# Patient Record
Sex: Female | Born: 1965 | Race: White | Hispanic: No | Marital: Married | Smoking: Former smoker
Health system: Southern US, Community
[De-identification: ages and names within clinical notes are randomized; demographics above are authoritative.]

## PROBLEM LIST (undated history)

## (undated) DIAGNOSIS — F419 Anxiety disorder, unspecified: Secondary | ICD-10-CM

## (undated) DIAGNOSIS — E785 Hyperlipidemia, unspecified: Secondary | ICD-10-CM

## (undated) DIAGNOSIS — J45909 Unspecified asthma, uncomplicated: Secondary | ICD-10-CM

## (undated) DIAGNOSIS — E119 Type 2 diabetes mellitus without complications: Secondary | ICD-10-CM

## (undated) DIAGNOSIS — E039 Hypothyroidism, unspecified: Secondary | ICD-10-CM

## (undated) DIAGNOSIS — F32A Depression, unspecified: Secondary | ICD-10-CM

## (undated) DIAGNOSIS — K219 Gastro-esophageal reflux disease without esophagitis: Secondary | ICD-10-CM

## (undated) DIAGNOSIS — E079 Disorder of thyroid, unspecified: Secondary | ICD-10-CM

## (undated) DIAGNOSIS — T7840XA Allergy, unspecified, initial encounter: Secondary | ICD-10-CM

## (undated) HISTORY — PX: NASAL SEPTUM SURGERY: SHX37

---

## 2005-10-21 ENCOUNTER — Ambulatory Visit: Payer: Self-pay | Admitting: Internal Medicine

## 2007-02-18 ENCOUNTER — Ambulatory Visit: Payer: Self-pay

## 2008-07-04 ENCOUNTER — Ambulatory Visit: Payer: Self-pay | Admitting: Internal Medicine

## 2011-09-09 ENCOUNTER — Ambulatory Visit: Payer: Self-pay | Admitting: Internal Medicine

## 2014-04-07 ENCOUNTER — Ambulatory Visit: Payer: Self-pay

## 2016-02-21 ENCOUNTER — Other Ambulatory Visit: Payer: Self-pay | Admitting: Obstetrics and Gynecology

## 2016-02-21 DIAGNOSIS — Z1231 Encounter for screening mammogram for malignant neoplasm of breast: Secondary | ICD-10-CM

## 2016-04-02 ENCOUNTER — Ambulatory Visit: Admission: RE | Admit: 2016-04-02 | Payer: Self-pay | Source: Ambulatory Visit

## 2017-04-14 ENCOUNTER — Other Ambulatory Visit: Payer: Self-pay | Admitting: Obstetrics and Gynecology

## 2017-04-14 DIAGNOSIS — Z1231 Encounter for screening mammogram for malignant neoplasm of breast: Secondary | ICD-10-CM

## 2017-04-16 ENCOUNTER — Encounter: Payer: Self-pay | Admitting: Radiology

## 2017-04-16 ENCOUNTER — Ambulatory Visit
Admission: RE | Admit: 2017-04-16 | Discharge: 2017-04-16 | Disposition: A | Discharge status: Home / Self Care | Payer: Managed Care, Other (non HMO) | Source: Ambulatory Visit | Attending: Obstetrics and Gynecology | Admitting: Obstetrics and Gynecology

## 2017-04-16 DIAGNOSIS — Z1231 Encounter for screening mammogram for malignant neoplasm of breast: Secondary | ICD-10-CM | POA: Insufficient documentation

## 2018-04-07 ENCOUNTER — Other Ambulatory Visit: Payer: Self-pay | Admitting: Obstetrics and Gynecology

## 2018-04-07 DIAGNOSIS — Z1231 Encounter for screening mammogram for malignant neoplasm of breast: Secondary | ICD-10-CM

## 2018-04-20 ENCOUNTER — Ambulatory Visit
Admission: RE | Admit: 2018-04-20 | Discharge: 2018-04-20 | Disposition: A | Discharge status: Home / Self Care | Payer: BLUE CROSS/BLUE SHIELD | Source: Ambulatory Visit | Attending: Obstetrics and Gynecology | Admitting: Obstetrics and Gynecology

## 2018-04-20 DIAGNOSIS — Z1231 Encounter for screening mammogram for malignant neoplasm of breast: Secondary | ICD-10-CM | POA: Diagnosis present

## 2019-07-27 ENCOUNTER — Ambulatory Visit: Payer: BLUE CROSS/BLUE SHIELD | Admitting: Dermatology

## 2019-07-28 ENCOUNTER — Ambulatory Visit: Payer: Commercial Managed Care - PPO | Admitting: Dermatology

## 2019-07-28 ENCOUNTER — Other Ambulatory Visit: Payer: Self-pay

## 2019-07-28 ENCOUNTER — Encounter: Payer: Self-pay | Admitting: Dermatology

## 2019-07-28 DIAGNOSIS — K130 Diseases of lips: Secondary | ICD-10-CM

## 2019-07-28 DIAGNOSIS — L853 Xerosis cutis: Secondary | ICD-10-CM | POA: Diagnosis not present

## 2019-07-28 DIAGNOSIS — L304 Erythema intertrigo: Secondary | ICD-10-CM | POA: Diagnosis not present

## 2019-07-28 DIAGNOSIS — L219 Seborrheic dermatitis, unspecified: Secondary | ICD-10-CM

## 2019-07-28 MED ORDER — KETOCONAZOLE 2 % EX SHAM: MEDICATED_SHAMPOO | CUTANEOUS | 1 refills | Status: AC

## 2019-07-28 MED ORDER — CLOBETASOL PROPIONATE 0.05 % EX FOAM: Freq: Two times a day (BID) | CUTANEOUS | 0 refills | Status: DC

## 2019-07-28 MED ORDER — HYDROCORTISONE 2.5 % EX CREA: TOPICAL_CREAM | Freq: Two times a day (BID) | CUTANEOUS | 3 refills | Status: AC | PRN

## 2019-07-28 MED ORDER — FLUCONAZOLE 200 MG PO TABS: 200.0000 mg | ORAL_TABLET | Freq: Every day | ORAL | 0 refills | Status: AC

## 2019-07-28 NOTE — Progress Notes (Signed)
   Follow-Up Visit   Subjective  Monica Dunn is a 54 y.o. female who presents for the following: Rash (around ears and in ears, and around lips, used Ketoconazole cream , TMC lotion, alcortin a,. does not like using the cream makes a mess at hairline and in ears.) and Dry feet (several years).  The following portions of the chart were reviewed this encounter and updated as appropriate: Allergies  Meds  Problems  Med Hx  Surg Hx  Fam Hx      Review of Systems: No other skin or systemic complaints.  Objective  Well appearing patient in no apparent distress; mood and affect are within normal limits.  A focused examination was performed including head, including the scalp, face, neck, nose, ears, eyelids, and lips. Relevant physical exam findings are noted in the Assessment and Plan.  Objective  Lips: scale and erythema of lips with erythematous papules at vermillion border  Objective  Gluteal Crease: Erythema gluteal crease  Objective  Left Ear, Right Ear: Pink patches with greasy scale.   Assessment & Plan  Cheilitis Lips  Favor contact dermatitis > fungal  Ketoconazole cream BID to lips twice daily Hydrocortisone cream BID to lips twice daily  Avoid all topicals other than the medications and plain vaseline or aquaphor lip balm  Patient is very concerned about this and has taken fluconazole in the past without any problems. Per patient request, will send in fluconazole 200 mg po, take once and repeat in one week if needed for this and intertrigo.   Erythema intertrigo Gluteal Crease  Will start Ketoconazole cream twice daily to buttocks (gluteal crease) Will start HC 2.5% cream twice daily prn irritation  Topical steroids (such as triamcinolone, fluocinolone, fluocinonide, mometasone, clobetasol, halobetasol, betamethasone, hydrocortisone) can cause thinning and lightening of the skin if they are used for too long in the same area. Your physician has selected  the right strength medicine for your problem and area affected on the body. Please use your medication only as directed by your physician to prevent side effects.    hydrocortisone 2.5 % cream - Gluteal Crease  Ordered Medications: fluconazole (DIFLUCAN) 200 MG tablet  Seborrheic dermatitis (2) Left Ear; Right Ear  Chronic, flared.  Scalp and ears: Start ketoconazole shampoo three times per week and leave in 10 minutes  Start clobetasol foam daily to affected areas as needed will start clobetasol solution if foam is not covered.  Face: Start hydrocortisone 2.5% cream twice a day as needed  Topical steroids (such as triamcinolone, fluocinolone, fluocinonide, mometasone, clobetasol, halobetasol, betamethasone, hydrocortisone) can cause thinning and lightening of the skin if they are used for too long in the same area. Your physician has selected the right strength medicine for your problem and area affected on the body. Please use your medication only as directed by your physician to prevent side effects.     Ordered Medications: clobetasol (OLUX) 0.05 % topical foam ketoconazole (NIZORAL) 2 % shampoo  Xerosis of feet - Can use urea 20% cream daily as needed - Can also use Baby Foot treatment.   Return in about 4 weeks (around 08/25/2019).   ILeward Quan, CMA, am acting as scribe for Darden Dates, MD .  Documentation: I have reviewed the above documentation for accuracy and completeness, and I agree with the above.  Darden Dates, MD

## 2019-07-28 NOTE — Patient Instructions (Addendum)
Scalp and ears: Start ketoconazole shampoo three times per week and leave in 10 minutes Start clobetasol foam daily to affected areas as needed will start clobetasol solution.if foam is not covered. Face: Start hydrocortisone 2.5% cream twice a day as needed  Will start Ketoconazole cream twice daily to buttocks (gluteal crease) Will start HC 2.5% cream twice daily   Ketoconazole cream BID to lips twice daily Hydrocortisone cream BID to lips twice daily  Topical steroids (such as triamcinolone, fluocinolone, fluocinonide, mometasone, clobetasol, halobetasol, betamethasone, hydrocortisone) can cause thinning and lightening of the skin if they are used for too long in the same area. Your physician has selected the right strength medicine for your problem and area affected on the body. Please use your medication only as directed by your physician to prevent side effects.   Fluconazole can rarely cause irritation of the liver or an allergic reaction.

## 2019-07-31 ENCOUNTER — Encounter: Payer: Self-pay | Admitting: Dermatology

## 2019-08-15 ENCOUNTER — Encounter: Payer: Self-pay | Admitting: Dermatology

## 2019-08-26 ENCOUNTER — Ambulatory Visit: Payer: Commercial Managed Care - PPO | Admitting: Dermatology

## 2019-08-31 ENCOUNTER — Telehealth: Payer: Self-pay

## 2019-08-31 NOTE — Telephone Encounter (Signed)
Spoke with patient regarding below MyChart message. She was advised to use the mupirocin twice a day to inside of nose and Vaseline in between as needed. Patient already has mupirocin so I did not need to send any in.    PS   [name] Female, 54 y.o., 1965/08/30 MRN:  [id number] Phone:  [phone] (H) ... PCP:  Kandyce Rud, MD [facility] This message is to inform you that the patient has not yet read the following message. (Notification date: August 30, 2019)  RE: Visit Follow-Up Question  From  [place], [place], MD To  Monica Dunn Sent and Delivered  08/16/2019 3:26 PM  Hi Ms. Heckler,   I am glad the foam is working and the ketoconazole is helping with your lips. For your nose, I would recommend applying prescription mupirocin antibiotic ointment twice a day to the inside of the nose, and vaseline as needed in between.   Be gentle when blowing your nose and be sure you only use soft tissues. We can recheck this at your follow-up appointment.   Can you please let us know which pharmacy you would like the mupirocin sent to?   Best,  Dr. Neale Burly   Previous Messages  ----- Message -----    From:CMA Butch Penny    Sent:08/16/2019 11:39 AM EDT     PY:[id number] E [place]  Subject:RE: Visit Follow-Up Question   Dr. Neale Burly will be in the office tomorrow and we can check with her.   ----- Message -----    From:[name]    Sent:08/15/2019 11:52 AM EDT     SN:[id number] MOYE, MD  Subject:Visit Follow-Up Question   Dr. Neale Burly,   I now have the pealing and redness inside my nose. The ketoconazole cream says not to put in nose. So what can I do about this? It is so embarrassing because it looks like my nose is dirty all the time. I do use Nasacort daily and I use nose spray all the time so I know my nose is not dried out. It did this once before recently but I didn't think anything about it. Now it has come back with a  vengeance. Definitely would like for it to be cleared up before my daughter's wedding on Sep 05, 2019.   Good news! The foam is working so good around my ears. And the ketoconazole cream seems to be working around my lips but it is slow. Oh and the CLOBETASOL PROP 0.05% FOAM is only $10 with the insurance.   Thank you for your help!  Eduard Roux   [employer] User Last Read On  Monica Dunn Not Read

## 2019-09-20 ENCOUNTER — Other Ambulatory Visit: Payer: Self-pay | Admitting: Dermatology

## 2019-09-20 DIAGNOSIS — L219 Seborrheic dermatitis, unspecified: Secondary | ICD-10-CM

## 2020-01-16 ENCOUNTER — Other Ambulatory Visit: Payer: Self-pay | Admitting: Family Medicine

## 2020-01-16 DIAGNOSIS — Z1231 Encounter for screening mammogram for malignant neoplasm of breast: Secondary | ICD-10-CM

## 2020-02-21 ENCOUNTER — Other Ambulatory Visit: Payer: Self-pay

## 2020-02-21 ENCOUNTER — Encounter: Payer: Self-pay | Admitting: Emergency Medicine

## 2020-02-21 ENCOUNTER — Emergency Department: Payer: Commercial Managed Care - PPO

## 2020-02-21 DIAGNOSIS — Z87891 Personal history of nicotine dependence: Secondary | ICD-10-CM | POA: Insufficient documentation

## 2020-02-21 DIAGNOSIS — R0789 Other chest pain: Secondary | ICD-10-CM | POA: Insufficient documentation

## 2020-02-21 DIAGNOSIS — Z79899 Other long term (current) drug therapy: Secondary | ICD-10-CM | POA: Insufficient documentation

## 2020-02-21 DIAGNOSIS — R0600 Dyspnea, unspecified: Secondary | ICD-10-CM | POA: Diagnosis not present

## 2020-02-21 DIAGNOSIS — E119 Type 2 diabetes mellitus without complications: Secondary | ICD-10-CM | POA: Diagnosis not present

## 2020-02-21 LAB — CBC
HCT: 44.5 % (ref 36.0–46.0)
Hemoglobin: 14.4 g/dL (ref 12.0–15.0)
MCH: 27.4 pg (ref 26.0–34.0)
MCHC: 32.4 g/dL (ref 30.0–36.0)
MCV: 84.6 fL (ref 80.0–100.0)
Platelets: 274 10*3/uL (ref 150–400)
RBC: 5.26 MIL/uL — ABNORMAL HIGH (ref 3.87–5.11)
RDW: 13.8 % (ref 11.5–15.5)
WBC: 12 10*3/uL — ABNORMAL HIGH (ref 4.0–10.5)
nRBC: 0 % (ref 0.0–0.2)

## 2020-02-21 LAB — BASIC METABOLIC PANEL
Anion gap: 8 (ref 5–15)
BUN: 15 mg/dL (ref 6–20)
CO2: 30 mmol/L (ref 22–32)
Calcium: 9.3 mg/dL (ref 8.9–10.3)
Chloride: 100 mmol/L (ref 98–111)
Creatinine, Ser: 0.83 mg/dL (ref 0.44–1.00)
GFR, Estimated: >60 mL/min (ref >60)
Glucose, Bld: 115 mg/dL — ABNORMAL HIGH (ref 70–99)
Potassium: 3.7 mmol/L (ref 3.5–5.1)
Sodium: 138 mmol/L (ref 135–145)

## 2020-02-21 LAB — TROPONIN I (HIGH SENSITIVITY)
Troponin I (High Sensitivity): 3 ng/L (ref <18)
Troponin I (High Sensitivity): 3 ng/L (ref <18)

## 2020-02-21 NOTE — ED Triage Notes (Signed)
Pt arrived via w/c from [facility] with reports of neck pain that radiated down into chest, pt states she has chest pain when she takes in a deep breath.  Pt reports slight cough that is productive at times.  Pt also reports she is having sinus issues.

## 2020-02-22 ENCOUNTER — Emergency Department: Payer: Commercial Managed Care - PPO

## 2020-02-22 ENCOUNTER — Emergency Department
Admission: EM | Admit: 2020-02-22 | Discharge: 2020-02-22 | Disposition: A | Discharge status: Home / Self Care | Payer: Commercial Managed Care - PPO | Attending: Emergency Medicine | Admitting: Emergency Medicine

## 2020-02-22 DIAGNOSIS — R079 Chest pain, unspecified: Secondary | ICD-10-CM

## 2020-02-22 HISTORY — DX: Disorder of thyroid, unspecified: E07.9

## 2020-02-22 HISTORY — DX: Type 2 diabetes mellitus without complications: E11.9

## 2020-02-22 MED ORDER — IOHEXOL 350 MG/ML SOLN
75.0000 mL | Freq: Once | INTRAVENOUS | Status: AC | PRN
  Administered 2020-02-22: 75 mL via INTRAVENOUS

## 2020-02-22 NOTE — ED Provider Notes (Addendum)
[facility] Emergency Department Provider Note  Time seen: 3:12 AM  I have reviewed the triage vital signs and the nursing notes.   HISTORY  Chief Complaint Chest Pain   HPI NYKIA TURKO is a 54 y.o. female with a past medical history of diabetes presents to the emergency department for chest pain.  According to the patient this evening she has developed some chest tightness and shortness of breath.  States is worse with deep inspiration.  Denies any cough congestion or fever.  No leg pain or swelling.  No history of cardiac disease.  Describes the tightness sensation is mild.   Past Medical History:  Diagnosis Date  . Diabetes mellitus without complication ([place])   . Thyroid disease     There are no problems to display for this patient.   Past Surgical History:  Procedure Laterality Date  . CESAREAN SECTION    . NASAL SEPTUM SURGERY      Prior to Admission medications   Medication Sig Start Date End Date Taking? Authorizing Provider  ALPRAZolam (XANAX) 0.25 MG tablet TAKE 1 TABLET BY MOUTH EVERY DAY AS NEEDED 07/14/19   [provider]  Blood Glucose Monitoring Suppl (ACCU-CHEK GUIDE) w/Device KIT USE AS DIRECTED. DX E11.9 03/02/19   [provider]  Blood Glucose Monitoring Suppl (FIFTY50 GLUCOSE METER 2.0) w/Device KIT Use as directed 03/02/19 03/01/20  [provider]  citalopram (CELEXA) 20 MG tablet Take by mouth. 01/03/19 01/03/20  [provider]  clobetasol (OLUX) 0.05 % topical foam APPLY TO AFFECTED AREA TWICE A DAY 09/21/19   Moye, [place], MD  doxycycline (VIBRAMYCIN) 100 MG capsule Take 100 mg by mouth 2 (two) times daily. 03/04/19   [provider]  fluconazole (DIFLUCAN) 200 MG tablet Take 1 tablet (200 mg total) by mouth daily. Take one tablet today, then 1 tablet in 1 week if needed 07/28/19   [facility], [place], MD  Fluticasone-Salmeterol (ADVAIR DISKUS) 250-50 MCG/DOSE AEPB INHALE 1 PUFF BY MOUTH  TWICE A DAY 07/05/19   [provider]  glipiZIDE (GLUCOTROL XL) 10 MG 24 hr tablet TAKE 1 TABLET BY MOUTH EVERY DAY 01/03/19   [provider]  hydrocortisone 2.5 % cream Apply topically 2 (two) times daily as needed (Rash). 07/28/19   Moye, [place], MD  ketoconazole (NIZORAL) 2 % shampoo Use up to  three times per week and leave in 10 minutes 07/28/19   Moye, [place], MD  levothyroxine (SYNTHROID) 137 MCG tablet Take by mouth. 04/12/19 04/11/20  [provider]  loratadine-pseudoephedrine (CLARITIN-D 24-HOUR) 10-240 MG 24 hr tablet TAKE 1 TABLET BY MOUTH EVERY DAY 09/01/18   [provider]  metFORMIN (GLUCOPHAGE-XR) 500 MG 24 hr tablet Take by mouth. 01/03/19   [provider]  montelukast (SINGULAIR) 10 MG tablet TAKE 1 TABLET BY MOUTH EVERY DAY AT NIGHT 01/03/19   [provider]  mupirocin ointment (BACTROBAN) 2 % APPLY TO AFFECTED AREA 3 TIMES A DAY FOR 7 DAYS 07/14/19   [provider]  nystatin (MYCOSTATIN/NYSTOP) powder APPLY TOPICALLY 2 TIMES DAILY AS NEEDED 06/06/19   [provider]  pantoprazole (PROTONIX) 40 MG tablet Take by mouth. 07/12/19 07/11/20  [provider]  simvastatin (ZOCOR) 10 MG tablet TAKE 1 TABLET BY MOUTH EVERY DAY AT NIGHT 01/03/19   [provider]  triamcinolone (NASACORT) 55 MCG/ACT AERO nasal inhaler Place into the nose.    [provider]    Allergies  Allergen Reactions  . Codeine  Nausea Only  . Norfloxacin Nausea Only  . Tramadol-Acetaminophen     Other reaction(s): Unknown  . Penicillins Rash    Family History  Problem Relation Age of Onset  . Prostate cancer Father   . Ovarian cancer Sister        late 14's  . Breast cancer Maternal Aunt        60's    Social History Social History   Tobacco Use  . Smoking status: Former Smoker    Quit date: 12/07/1993    Years since quitting: 26.2  . Smokeless tobacco: Never Used  Vaping Use  . Vaping Use: Never used   Substance Use Topics  . Alcohol use: Not on file  . Drug use: Not on file    Review of Systems Constitutional: Negative for fever. Cardiovascular: Mild chest tightness, worse with deep inspiration. Respiratory: Mild shortness of breath. Gastrointestinal: Negative for abdominal pain, vomiting Musculoskeletal: Negative for musculoskeletal complaints Neurological: Negative for headache All other ROS negative  ____________________________________________   PHYSICAL EXAM:  VITAL SIGNS: ED Triage Vitals  Enc Vitals Group     BP 02/21/20 1907 (!) 141/61     Pulse Rate 02/21/20 1907 (!) 103     Resp 02/21/20 1907 17     Temp 02/21/20 1907 98.7 F (37.1 C)     Temp Source 02/21/20 1907 Oral     SpO2 02/21/20 1907 97 %     Weight 02/21/20 1907 231 lb (104.8 kg)     Height 02/21/20 1907 _0  (1.6 m)     Head Circumference --      Peak Flow --      Pain Score 02/21/20 1917 7     Pain Loc --      Pain Edu? --      Excl. in [place]? --     Constitutional: Alert and oriented. Well appearing and in no distress. Eyes: Normal exam ENT      Head: Normocephalic and atraumatic.      Mouth/Throat: Mucous membranes are moist. Cardiovascular: Normal rate, regular rhythm around 100 bpm.  No murmur. Respiratory: Normal respiratory effort without tachypnea nor retractions. Breath sounds are clear  Gastrointestinal: Soft and nontender. No distention.  Musculoskeletal: Nontender with normal range of motion in all extremities. No lower extremity tenderness or edema. Neurologic:  Normal speech and language. No gross focal neurologic deficits are appreciated. Skin:  Skin is warm, dry and intact.   Psychiatric: Mood and affect are normal. Speech and behavior are normal.   ____________________________________________    EKG  EKG viewed and interpreted by myself shows sinus tachycardia 105 bpm with a narrow QRS, left axis deviation, largely normal intervals with nonspecific ST  changes.  ____________________________________________    RADIOLOGY  X-rays negative CTA of the chest does not appear to show any large pulmonary embolism on my evaluation of the images. CT of the chest read as negative.  ____________________________________________   INITIAL IMPRESSION / ASSESSMENT AND PLAN / ED COURSE  Pertinent labs & imaging results that were available during my care of the patient were reviewed by me and considered in my medical decision making (see chart for details).   Patient presents to the emergency department for chest tightness and shortness of breath.  Patient states her symptoms are worse with deep inspiration.  Patient's work-up is thus far reassuring including negative troponin x2.  Chest x-ray is normal and EKG is reassuring.  However given the patient's symptoms of pleuritic chest discomfort  along with mild tachycardia we will proceed with a CTA of the chest to rule out pulmonary embolism.  I also discussed the possibility of Covid, patient is fully vaccinated and does not wish to have a Covid test performed.  CTA is negative for PE.  I discussed with the patient the incidental thyroid finding.  We will discharge the patient home with PCP follow-up.  Patient agreeable to plan of care.  DARIUS FILLINGIM was evaluated in Emergency Department on 02/22/2020 for the symptoms described in the history of present illness. She was evaluated in the context of the global COVID-19 pandemic, which necessitated consideration that the patient might be at risk for infection with the SARS-CoV-2 virus that causes COVID-19. Institutional protocols and algorithms that pertain to the evaluation of patients at risk for COVID-19 are in a state of rapid change based on information released by regulatory bodies including the CDC and federal and state organizations. These policies and algorithms were followed during the patient's care in the  ED.  ____________________________________________   FINAL CLINICAL IMPRESSION(S) / ED DIAGNOSES  Dyspnea Chest pain   Harvest Dark, MD 02/22/20 [id number]    Harvest Dark, MD 02/22/20 [phone]

## 2020-02-22 NOTE — Discharge Instructions (Addendum)
Please follow-up with your doctor regarding today's emergency department visit.  Return to the emergency department for any return of/worsening chest pain, trouble breathing, or any other symptom personally concerning to yourself.    Your CT scan of the chest did show a right-sided thyroid nodule, please follow-up with your primary care doctor to arrange for further evaluation such as a nonemergent ultrasound.

## 2020-05-10 ENCOUNTER — Other Ambulatory Visit: Payer: Self-pay

## 2020-05-10 ENCOUNTER — Other Ambulatory Visit
Admission: RE | Admit: 2020-05-10 | Discharge: 2020-05-10 | Disposition: A | Discharge status: Home / Self Care | Payer: Commercial Managed Care - PPO | Source: Ambulatory Visit | Attending: Internal Medicine | Admitting: Internal Medicine

## 2020-05-10 DIAGNOSIS — Z01812 Encounter for preprocedural laboratory examination: Secondary | ICD-10-CM | POA: Diagnosis present

## 2020-05-10 DIAGNOSIS — Z20822 Contact with and (suspected) exposure to covid-19: Secondary | ICD-10-CM | POA: Diagnosis not present

## 2020-05-10 LAB — SARS CORONAVIRUS 2 (TAT 6-24 HRS): SARS Coronavirus 2: NEGATIVE

## 2020-05-11 ENCOUNTER — Encounter: Payer: Self-pay | Admitting: Internal Medicine

## 2020-05-14 ENCOUNTER — Encounter
Admission: RE | Disposition: A | Discharge status: Home / Self Care | Payer: Self-pay | Source: Home / Self Care | Attending: Internal Medicine

## 2020-05-14 ENCOUNTER — Encounter: Payer: Self-pay | Admitting: Internal Medicine

## 2020-05-14 ENCOUNTER — Ambulatory Visit: Payer: Commercial Managed Care - PPO | Admitting: Anesthesiology

## 2020-05-14 ENCOUNTER — Ambulatory Visit
Admission: RE | Admit: 2020-05-14 | Discharge: 2020-05-14 | Disposition: A | Discharge status: Home / Self Care | Payer: Commercial Managed Care - PPO | Attending: Internal Medicine | Admitting: Internal Medicine

## 2020-05-14 ENCOUNTER — Other Ambulatory Visit: Payer: Self-pay

## 2020-05-14 DIAGNOSIS — Z7951 Long term (current) use of inhaled steroids: Secondary | ICD-10-CM | POA: Diagnosis not present

## 2020-05-14 DIAGNOSIS — Z87891 Personal history of nicotine dependence: Secondary | ICD-10-CM | POA: Diagnosis not present

## 2020-05-14 DIAGNOSIS — Z7984 Long term (current) use of oral hypoglycemic drugs: Secondary | ICD-10-CM | POA: Insufficient documentation

## 2020-05-14 DIAGNOSIS — Z79890 Hormone replacement therapy (postmenopausal): Secondary | ICD-10-CM | POA: Insufficient documentation

## 2020-05-14 DIAGNOSIS — Z79899 Other long term (current) drug therapy: Secondary | ICD-10-CM | POA: Insufficient documentation

## 2020-05-14 DIAGNOSIS — K640 First degree hemorrhoids: Secondary | ICD-10-CM | POA: Insufficient documentation

## 2020-05-14 DIAGNOSIS — Z1211 Encounter for screening for malignant neoplasm of colon: Secondary | ICD-10-CM | POA: Diagnosis not present

## 2020-05-14 HISTORY — DX: Unspecified asthma, uncomplicated: J45.909

## 2020-05-14 HISTORY — DX: Anxiety disorder, unspecified: F41.9

## 2020-05-14 HISTORY — PX: COLONOSCOPY WITH PROPOFOL: SHX5780

## 2020-05-14 HISTORY — DX: Depression, unspecified: F32.A

## 2020-05-14 HISTORY — DX: Gastro-esophageal reflux disease without esophagitis: K21.9

## 2020-05-14 HISTORY — DX: Allergy, unspecified, initial encounter: T78.40XA

## 2020-05-14 HISTORY — DX: Hypothyroidism, unspecified: E03.9

## 2020-05-14 HISTORY — DX: Hyperlipidemia, unspecified: E78.5

## 2020-05-14 LAB — GLUCOSE, CAPILLARY: Glucose-Capillary: 171 mg/dL — ABNORMAL HIGH (ref 70–99)

## 2020-05-14 SURGERY — COLONOSCOPY WITH PROPOFOL
Anesthesia: General

## 2020-05-14 MED ORDER — PROPOFOL 500 MG/50ML IV EMUL
INTRAVENOUS | Status: DC | PRN
  Administered 2020-05-14: 150 ug/kg/min via INTRAVENOUS

## 2020-05-14 MED ORDER — SODIUM CHLORIDE 0.9 % IV SOLN: INTRAVENOUS | Status: DC

## 2020-05-14 MED ORDER — PROPOFOL 500 MG/50ML IV EMUL
INTRAVENOUS | Status: AC
  Filled 2020-05-14: qty 50

## 2020-05-14 NOTE — Transfer of Care (Signed)
Immediate Anesthesia Transfer of Care Note  Patient: Monica Dunn  Procedure(s) Performed: COLONOSCOPY WITH PROPOFOL (N/A )  Patient Location: PACU  Anesthesia Type:General  Level of Consciousness: awake and sedated  Airway & Oxygen Therapy: Patient Spontanous Breathing and Patient connected to face mask oxygen  Post-op Assessment: Report given to RN and Post -op Vital signs reviewed and stable  Post vital signs: Reviewed and stable  Last Vitals:  Vitals Value Taken Time  BP    Temp    Pulse    Resp    SpO2      Last Pain:  Vitals:   05/14/20 1012  TempSrc: Temporal  PainSc: 0-No pain         Complications: No complications documented.

## 2020-05-14 NOTE — Interval H&P Note (Signed)
History and Physical Interval Note:  05/14/2020 10:14 AM  Monica Dunn  has presented today for surgery, with the diagnosis of Colon Cancer Screening.  The various methods of treatment have been discussed with the patient and family. After consideration of risks, benefits and other options for treatment, the patient has consented to  Procedure(s) with comments: COLONOSCOPY WITH PROPOFOL (N/A) - COVID 05-10-2020 as a surgical intervention.  The patient's history has been reviewed, patient examined, no change in status, stable for surgery.  I have reviewed the patient's chart and labs.  Questions were answered to the patient's satisfaction.     [place]Ne[place], [place]

## 2020-05-14 NOTE — Anesthesia Preprocedure Evaluation (Signed)
Anesthesia Evaluation  Patient identified by MRN, date of birth, ID band Patient awake    Reviewed: Allergy & Precautions, H&P , NPO status , Patient's Chart, lab work & pertinent test results, reviewed documented beta blocker date and time   Airway Mallampati: II   Neck ROM: full    Dental  (+) Poor Dentition   Pulmonary asthma , former smoker,    Pulmonary exam normal        Cardiovascular Exercise Tolerance: Good negative cardio ROS Normal cardiovascular exam Rhythm:regular Rate:Normal     Neuro/Psych PSYCHIATRIC DISORDERS Anxiety Depression negative neurological ROS     GI/Hepatic Neg liver ROS, GERD  Medicated,  Endo/Other  diabetes, Well Controlled, Type 2, Oral Hypoglycemic AgentsHypothyroidism   Renal/GU negative Renal ROS  negative genitourinary   Musculoskeletal   Abdominal   Peds  Hematology negative hematology ROS (+)   Anesthesia Other Findings Past Medical History: No date: Allergy No date: Anxiety No date: Asthma No date: Depression No date: Diabetes mellitus without complication ([facility]) No date: GERD (gastroesophageal reflux disease) No date: Hyperlipidemia No date: Hypothyroidism No date: Thyroid disease Past Surgical History: No date: CESAREAN SECTION No date: NASAL SEPTUM SURGERY   Reproductive/Obstetrics negative OB ROS                             Anesthesia Physical Anesthesia Plan  ASA: III  Anesthesia Plan: General   Post-op Pain Management:    Induction:   PONV Risk Score and Plan:   Airway Management Planned:   Additional Equipment:   Intra-op Plan:   Post-operative Plan:   Informed Consent: I have reviewed the patients History and Physical, chart, labs and discussed the procedure including the risks, benefits and alternatives for the proposed anesthesia with the patient or authorized representative who has indicated his/her understanding and  acceptance.     Dental Advisory Given  Plan Discussed with: CRNA  Anesthesia Plan Comments:         Anesthesia Quick Evaluation

## 2020-05-14 NOTE — H&P (Signed)
Outpatient short stay form Pre-procedure 05/14/2020 10:13 AM Monica Dunn K. [name], M.D.  Primary Physician: Derinda Late, M.D.  Reason for visit:  Colon cancer screening  History of present illness:  Patient presents for colonoscopy for colon cancer screening. The patient denies complaints of abdominal pain, significant change in bowel habits, or rectal bleeding.      Current Facility-Administered Medications:  .  0.9 %  sodium chloride infusion, , Intravenous, Continuous, Zaina Jenkin, Benay Pike, MD  Medications Prior to Admission  Medication Sig Dispense Refill Last Dose  . ALPRAZolam (XANAX) 0.25 MG tablet TAKE 1 TABLET BY MOUTH EVERY DAY AS NEEDED   Past Week at Unknown time  . Blood Glucose Monitoring Suppl (ACCU-CHEK GUIDE) w/Device KIT USE AS DIRECTED. DX E11.9   05/14/2020 at Unknown time  . clobetasol (OLUX) 0.05 % topical foam APPLY TO AFFECTED AREA TWICE A DAY 50 g 0 Past Week at 0800  . Fluticasone-Salmeterol (ADVAIR) 250-50 MCG/DOSE AEPB INHALE 1 PUFF BY MOUTH TWICE A DAY   Past Week at Unknown time  . glipiZIDE (GLUCOTROL XL) 10 MG 24 hr tablet TAKE 1 TABLET BY MOUTH EVERY DAY   05/13/2020 at 0800  . hydrocortisone 2.5 % cream Apply topically 2 (two) times daily as needed (Rash). 90 g 3 Past Week at Unknown time  . ketoconazole (NIZORAL) 2 % shampoo Use up to  three times per week and leave in 10 minutes 120 mL 1 Past Week at Unknown time  . loratadine-pseudoephedrine (CLARITIN-D 24-HOUR) 10-240 MG 24 hr tablet TAKE 1 TABLET BY MOUTH EVERY DAY   05/13/2020 at 1800  . metFORMIN (GLUCOPHAGE-XR) 500 MG 24 hr tablet Take by mouth.   Past Week at Unknown time  . montelukast (SINGULAIR) 10 MG tablet TAKE 1 TABLET BY MOUTH EVERY DAY AT NIGHT   05/13/2020 at 1800  . mupirocin ointment (BACTROBAN) 2 % APPLY TO AFFECTED AREA 3 TIMES A DAY FOR 7 DAYS   Past Week at Unknown time  . nystatin (MYCOSTATIN/NYSTOP) powder APPLY TOPICALLY 2 TIMES DAILY AS NEEDED   Past Week at Unknown time  . pantoprazole  (PROTONIX) 40 MG tablet Take by mouth.   05/13/2020 at 0810  . simvastatin (ZOCOR) 10 MG tablet TAKE 1 TABLET BY MOUTH EVERY DAY AT NIGHT   05/13/2020 at 1800  . triamcinolone (NASACORT) 55 MCG/ACT AERO nasal inhaler Place into the nose.   Past Week at Unknown time  . citalopram (CELEXA) 20 MG tablet Take by mouth.     . doxycycline (VIBRAMYCIN) 100 MG capsule Take 100 mg by mouth 2 (two) times daily. (Patient not taking: Reported on 05/14/2020)   Completed Course at Unknown time  . fluconazole (DIFLUCAN) 200 MG tablet Take 1 tablet (200 mg total) by mouth daily. Take one tablet today, then 1 tablet in 1 week if needed (Patient not taking: Reported on 05/14/2020) 2 tablet 0 Completed Course at Unknown time  . levothyroxine (SYNTHROID) 137 MCG tablet Take by mouth.        Allergies  Allergen Reactions  . Codeine Nausea Only  . Norfloxacin Nausea Only  . Tramadol-Acetaminophen     Other reaction(s): Unknown  . Penicillins Rash     Past Medical History:  Diagnosis Date  . Allergy   . Anxiety   . Asthma   . Depression   . Diabetes mellitus without complication ([place])   . GERD (gastroesophageal reflux disease)   . Hypothyroidism   . Thyroid disease     Review of systems:  Otherwise negative.    Physical Exam  Gen: Alert, oriented. Appears stated age.  HEENT: [place]/AT. PERRLA. Lungs: CTA, no wheezes. CV: RR nl S1, S2. Abd: soft, benign, no masses. BS+ Ext: No edema. Pulses 2+    Planned procedures: Proceed with colonoscopy. The patient understands the nature of the planned procedure, indications, risks, alternatives and potential complications including but not limited to bleeding, infection, perforation, damage to internal organs and possible oversedation/side effects from anesthesia. The patient agrees and gives consent to proceed.  Please refer to procedure notes for findings, recommendations and patient disposition/instructions.     [name] [name],  M.D. Gastroenterology 05/14/2020  10:13 AM

## 2020-05-14 NOTE — Op Note (Signed)
[facility] Gastroenterology Patient Name: Monica Dunn Procedure Date: 05/14/2020 10:22 AM MRN: [id number] Account #: [id number] Date of Birth: May 22, 1965 Admit Type: Outpatient Age: 55 Room: [facility] ENDO ROOM 2 Gender: Female Note Status: Finalized Procedure:             Colonoscopy Indications:           Screening for colorectal malignant neoplasm Providers:             Boykin Nearing. Norma Fredrickson MD, MD Referring MD:          Hassell Halim MD (Referring MD) Medicines:             Propofol per Anesthesia Complications:         No immediate complications. Procedure:             Pre-Anesthesia Assessment:                        - The risks and benefits of the procedure and the                         sedation options and risks were discussed with the                         patient. All questions were answered and informed                         consent was obtained.                        - Patient identification and proposed procedure were                         verified prior to the procedure by the nurse. The                         procedure was verified in the procedure room.                        - ASA Grade Assessment: III - A patient with severe                         systemic disease.                        - After reviewing the risks and benefits, the patient                         was deemed in satisfactory condition to undergo the                         procedure.                        After obtaining informed consent, the colonoscope was                         passed under direct vision. Throughout the procedure,                         the patient's blood pressure,  pulse, and oxygen                         saturations were monitored continuously. The                         Colonoscope was introduced through the anus and                         advanced to the the cecum, identified by appendiceal                         orifice and ileocecal  valve. The colonoscopy was                         performed without difficulty. The patient tolerated                         the procedure well. The quality of the bowel                         preparation was adequate. The ileocecal valve,                         appendiceal orifice, and rectum were photographed. Findings:      The perianal and digital rectal examinations were normal. Pertinent       negatives include normal sphincter tone and no palpable rectal lesions.      Non-bleeding internal hemorrhoids were found during retroflexion. The       hemorrhoids were Grade I (internal hemorrhoids that do not prolapse).      The colon (entire examined portion) appeared normal. Impression:            - Non-bleeding internal hemorrhoids.                        - The entire examined colon is normal.                        - No specimens collected. Recommendation:        - Patient has a contact number available for                         emergencies. The signs and symptoms of potential                         delayed complications were discussed with the patient.                         Return to normal activities tomorrow. Written                         discharge instructions were provided to the patient.                        - Resume previous diet.                        - Continue present medications.                        -  Repeat colonoscopy in 10 years for screening                         purposes.                        - Return to GI office PRN.                        - The findings and recommendations were discussed with                         the patient. Procedure Code(s):     --- Professional ---                        M3846, Colorectal cancer screening; colonoscopy on                         individual not meeting criteria for high risk Diagnosis Code(s):     --- Professional ---                        K64.0, First degree hemorrhoids                        Z12.11,  Encounter for screening for malignant neoplasm                         of colon CPT copyright 2019 [employer]. All rights reserved. The codes documented in this report are preliminary and upon coder review may  be revised to meet current compliance requirements. Stanton Kidney MD, MD 05/14/2020 10:49:18 AM This report has been signed electronically. Number of Addenda: 0 Note Initiated On: 05/14/2020 10:22 AM Scope Withdrawal Time: 0 hours 7 minutes 7 seconds  Total Procedure Duration: 0 hours 14 minutes 17 seconds  Estimated Blood Loss:  Estimated blood loss: none.      [facility]

## 2020-05-14 NOTE — Anesthesia Procedure Notes (Signed)
Performed by: Cook-Martin, Cindy °Pre-anesthesia Checklist: Patient identified, Emergency Drugs available, Suction available, Patient being monitored and Timeout performed °Patient Re-evaluated:Patient Re-evaluated prior to induction °Oxygen Delivery Method: Simple face mask °Preoxygenation: Pre-oxygenation with 100% oxygen °Induction Type: IV induction °Placement Confirmation: positive ETCO2 and CO2 detector ° ° ° ° ° ° °

## 2020-05-14 NOTE — Anesthesia Postprocedure Evaluation (Signed)
Anesthesia Post Note  Patient: Monica Dunn  Procedure(s) Performed: COLONOSCOPY WITH PROPOFOL (N/A )  Patient location during evaluation: PACU Anesthesia Type: General Level of consciousness: awake and alert Pain management: pain level controlled Vital Signs Assessment: post-procedure vital signs reviewed and stable Respiratory status: spontaneous breathing, nonlabored ventilation, respiratory function stable and patient connected to nasal cannula oxygen Cardiovascular status: blood pressure returned to baseline and stable Postop Assessment: no apparent nausea or vomiting Anesthetic complications: no   No complications documented.   Last Vitals:  Vitals:   05/14/20 1012 05/14/20 1051  BP: 135/79 112/64  Pulse: 89 (!) 102  Resp: 18 16  Temp: (!) 36.3 C 36.4 C  SpO2: 98% 100%    Last Pain:  Vitals:   05/14/20 1051  TempSrc:   PainSc: 0-No pain                 Yevette Edwards

## 2020-05-15 ENCOUNTER — Encounter: Payer: Self-pay | Admitting: Internal Medicine

## 2020-06-22 ENCOUNTER — Other Ambulatory Visit: Payer: Self-pay | Admitting: Family Medicine

## 2020-06-22 DIAGNOSIS — E041 Nontoxic single thyroid nodule: Secondary | ICD-10-CM

## 2020-07-02 ENCOUNTER — Ambulatory Visit
Admission: RE | Admit: 2020-07-02 | Discharge: 2020-07-02 | Disposition: A | Discharge status: Home / Self Care | Payer: Commercial Managed Care - PPO | Source: Ambulatory Visit | Attending: Family Medicine | Admitting: Family Medicine

## 2020-07-02 ENCOUNTER — Other Ambulatory Visit: Payer: Self-pay

## 2020-07-02 DIAGNOSIS — E041 Nontoxic single thyroid nodule: Secondary | ICD-10-CM | POA: Diagnosis present

## 2020-07-30 ENCOUNTER — Other Ambulatory Visit: Payer: Self-pay | Admitting: Dermatology

## 2020-08-31 ENCOUNTER — Other Ambulatory Visit: Payer: Self-pay

## 2020-08-31 ENCOUNTER — Ambulatory Visit
Admission: RE | Admit: 2020-08-31 | Discharge: 2020-08-31 | Disposition: A | Discharge status: Home / Self Care | Payer: Commercial Managed Care - PPO | Source: Ambulatory Visit | Attending: Family Medicine | Admitting: Family Medicine

## 2020-08-31 DIAGNOSIS — Z1231 Encounter for screening mammogram for malignant neoplasm of breast: Secondary | ICD-10-CM | POA: Insufficient documentation

## 2021-06-21 IMAGING — MG MM DIGITAL SCREENING BILAT W/ TOMO AND CAD
8 series · 8 of 24 positions shown · non-contrast
Comparison: Previous exam(s).

CLINICAL DATA: Screening.

EXAM:
DIGITAL SCREENING BILATERAL MAMMOGRAM WITH TOMOSYNTHESIS AND CAD
TECHNIQUE: Bilateral screening digital craniocaudal and mediolateral oblique
mammograms were obtained. Bilateral screening digital breast
tomosynthesis was performed. The images were evaluated with
computer-aided detection.

[R CC synth-2D]
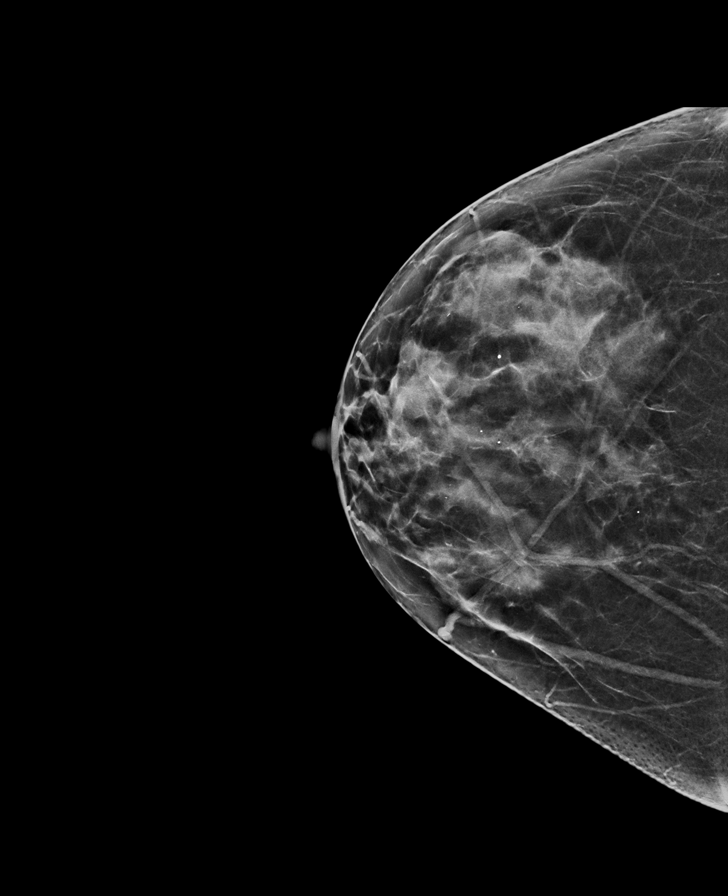

[L MLO synth-2D]
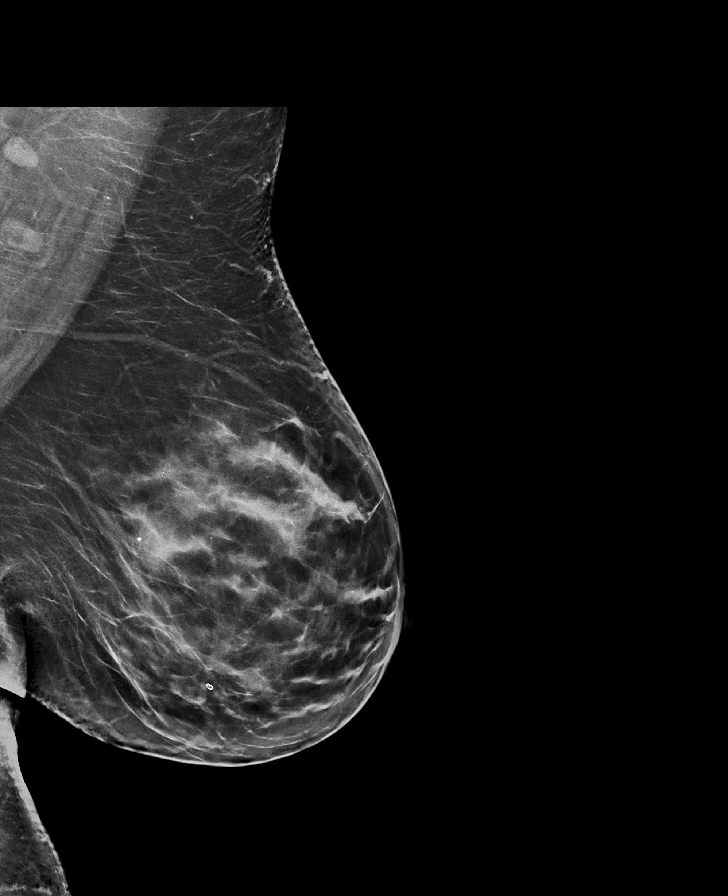

[R MLO synth-2D]
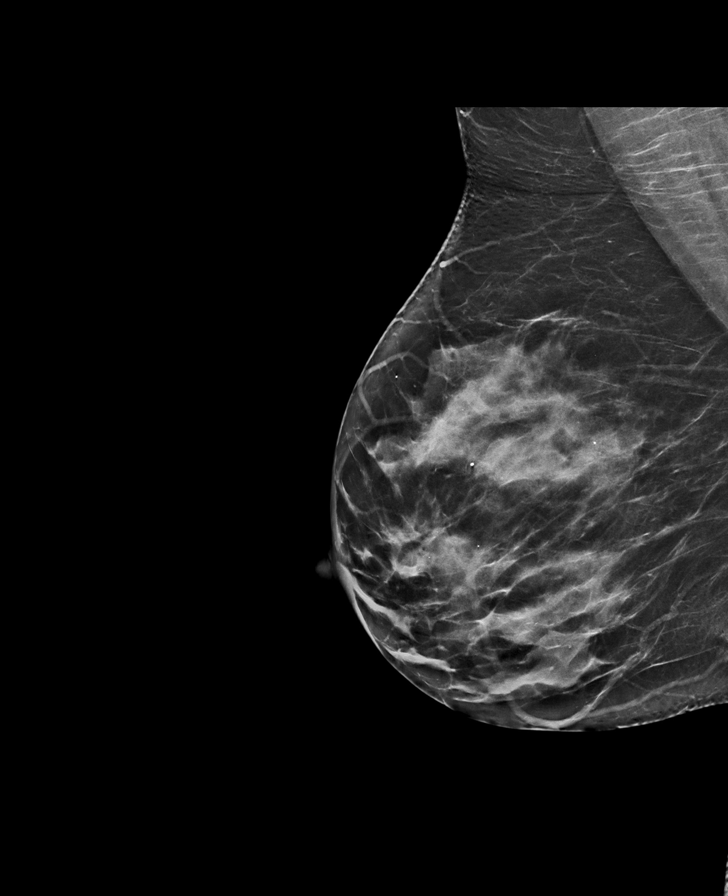

[L CC synth-2D]
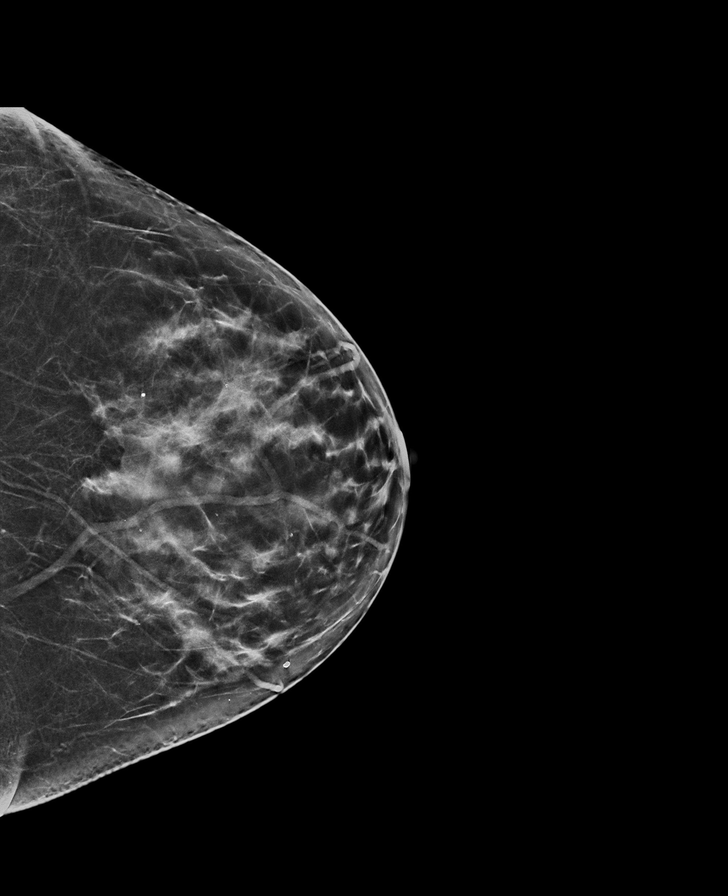

[R CC tomo · tomo slice 33/66.0]
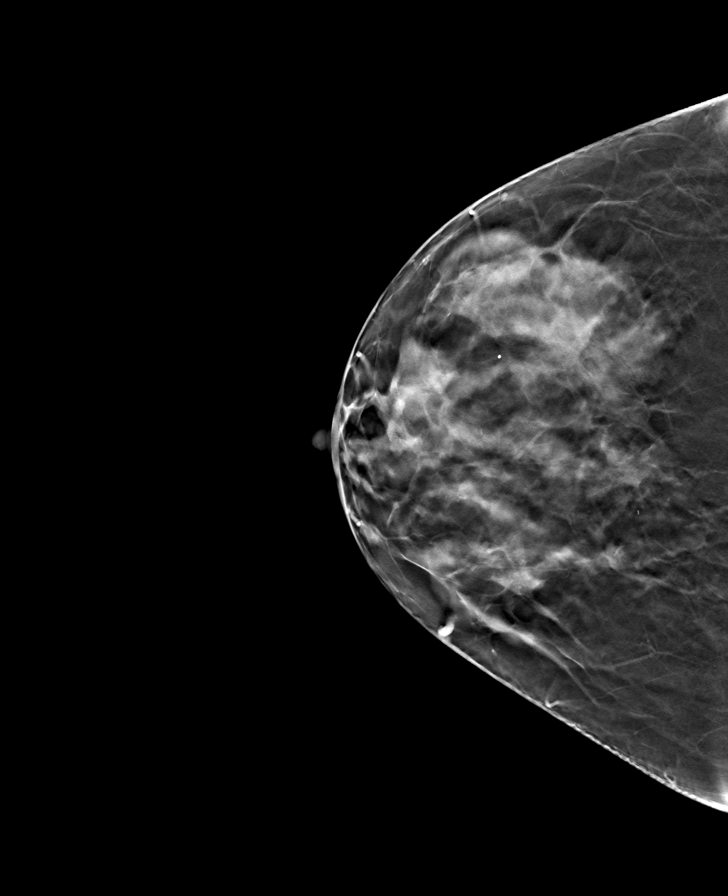

[L MLO tomo · tomo slice 42/83.0]
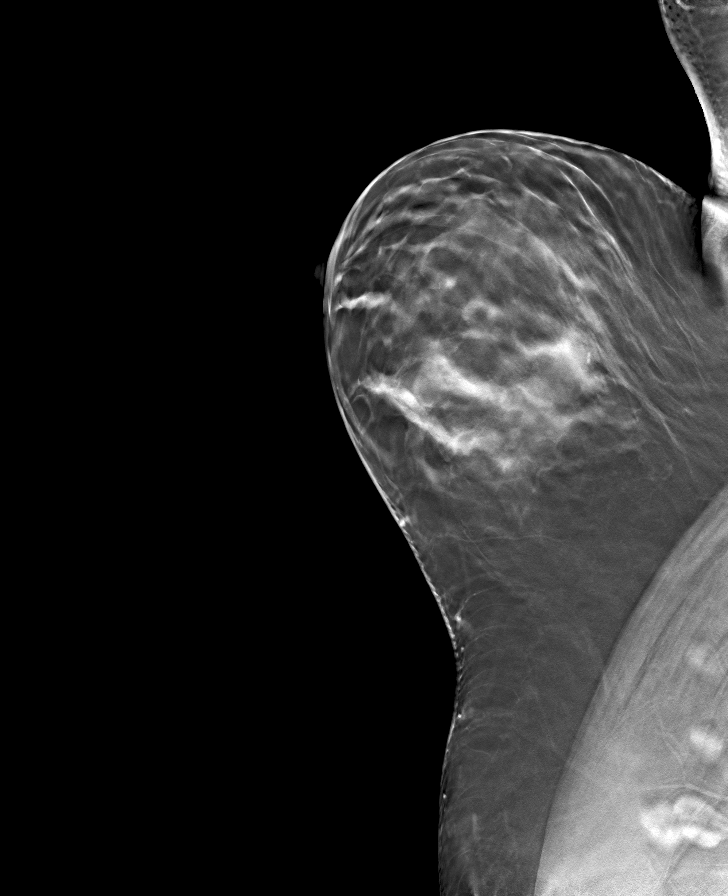

[L CC tomo · tomo slice 35/69.0]
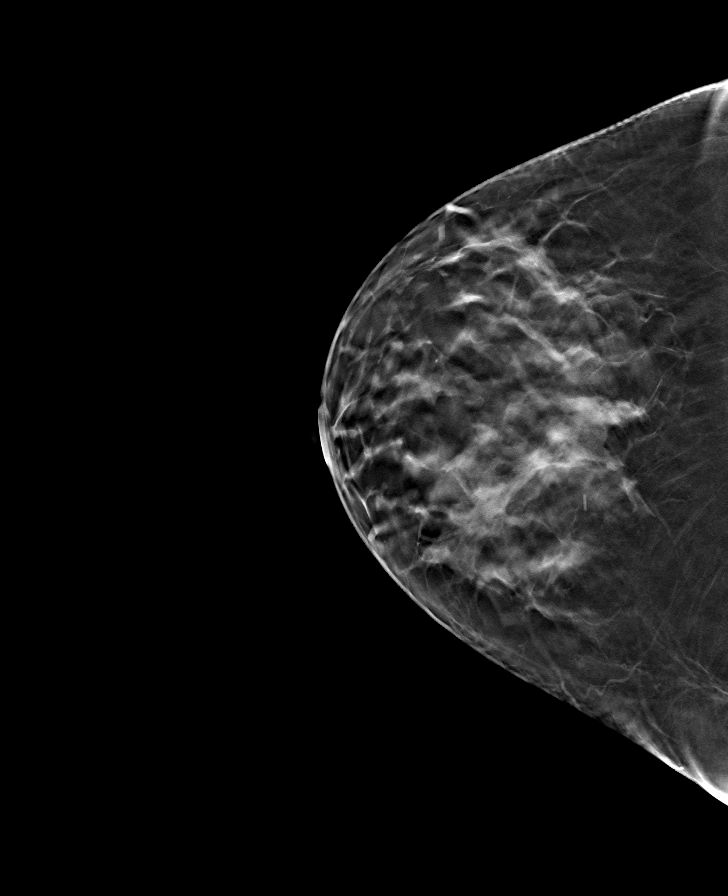

[R MLO tomo · tomo slice 39/76.0]
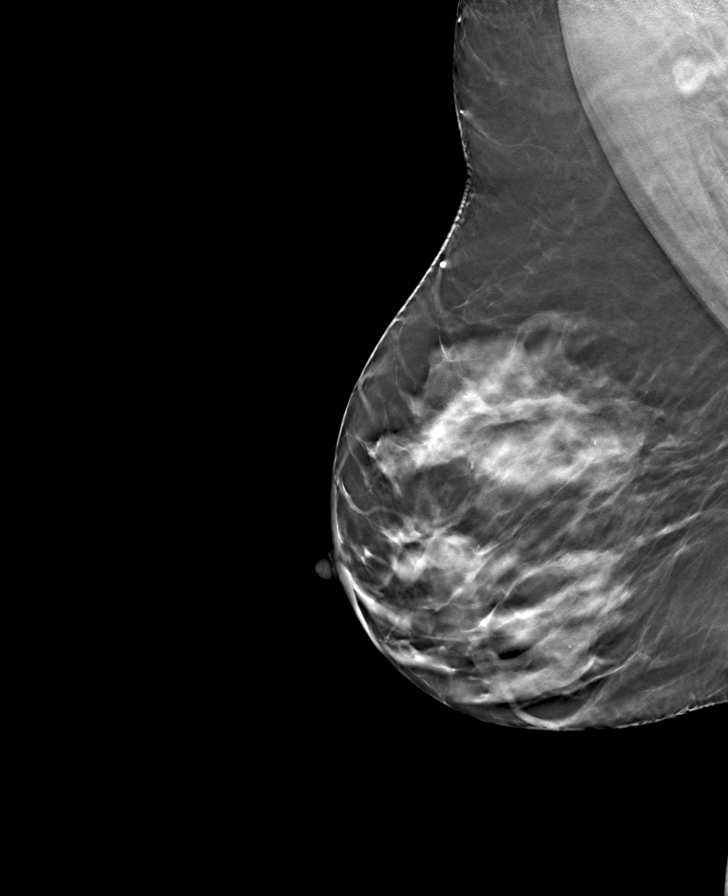

[8 of 24 positions shown; findings below may reference images not displayed]

ACR Breast Density Category c: The breast tissue is heterogeneously
dense, which may obscure small masses.
FINDINGS: There are no findings suspicious for malignancy. The images were
evaluated with computer-aided detection.
IMPRESSION: No mammographic evidence of malignancy. A result letter of this
screening mammogram will be mailed directly to the patient.

RECOMMENDATION:
Screening mammogram in one year. (Code:T4-5-GWO)

BI-RADS CATEGORY  1: Negative.

## 2022-01-03 ENCOUNTER — Other Ambulatory Visit: Payer: Self-pay | Admitting: Family Medicine

## 2022-01-03 DIAGNOSIS — Z1231 Encounter for screening mammogram for malignant neoplasm of breast: Secondary | ICD-10-CM

## 2022-01-31 ENCOUNTER — Ambulatory Visit
Admission: RE | Admit: 2022-01-31 | Discharge: 2022-01-31 | Disposition: A | Discharge status: Home / Self Care | Payer: Commercial Managed Care - PPO | Source: Ambulatory Visit | Attending: Family Medicine | Admitting: Family Medicine

## 2022-01-31 DIAGNOSIS — Z1231 Encounter for screening mammogram for malignant neoplasm of breast: Secondary | ICD-10-CM | POA: Insufficient documentation

## 2023-03-06 ENCOUNTER — Other Ambulatory Visit: Payer: Self-pay | Admitting: Family Medicine

## 2023-03-06 DIAGNOSIS — Z1231 Encounter for screening mammogram for malignant neoplasm of breast: Secondary | ICD-10-CM

## 2023-03-20 ENCOUNTER — Ambulatory Visit
Admission: RE | Admit: 2023-03-20 | Discharge: 2023-03-20 | Disposition: A | Discharge status: Home / Self Care | Payer: Commercial Managed Care - PPO | Source: Ambulatory Visit | Attending: Family Medicine | Admitting: Family Medicine

## 2023-03-20 DIAGNOSIS — Z1231 Encounter for screening mammogram for malignant neoplasm of breast: Secondary | ICD-10-CM | POA: Diagnosis present
